# Patient Record
Sex: Female | Born: 1945
Health system: Southern US, Community
[De-identification: ages and names within clinical notes are randomized; demographics above are authoritative.]

## PROBLEM LIST (undated history)

## (undated) DIAGNOSIS — M5126 Other intervertebral disc displacement, lumbar region: Secondary | ICD-10-CM

## (undated) DIAGNOSIS — I10 Essential (primary) hypertension: Secondary | ICD-10-CM

## (undated) DIAGNOSIS — G459 Transient cerebral ischemic attack, unspecified: Secondary | ICD-10-CM

## (undated) DIAGNOSIS — K449 Diaphragmatic hernia without obstruction or gangrene: Secondary | ICD-10-CM

---

## 2010-11-13 ENCOUNTER — Emergency Department (HOSPITAL_BASED_OUTPATIENT_CLINIC_OR_DEPARTMENT_OTHER)
Admission: EM | Admit: 2010-11-13 | Discharge: 2010-11-13 | Payer: Self-pay | Source: Home / Self Care | Admitting: Emergency Medicine

## 2011-11-16 ENCOUNTER — Encounter: Payer: Self-pay | Admitting: Emergency Medicine

## 2011-11-16 ENCOUNTER — Emergency Department (HOSPITAL_BASED_OUTPATIENT_CLINIC_OR_DEPARTMENT_OTHER)
Admission: EM | Admit: 2011-11-16 | Discharge: 2011-11-16 | Disposition: A | Discharge status: Home / Self Care | Payer: No Typology Code available for payment source | Attending: Emergency Medicine | Admitting: Emergency Medicine

## 2011-11-16 ENCOUNTER — Emergency Department (INDEPENDENT_AMBULATORY_CARE_PROVIDER_SITE_OTHER): Payer: No Typology Code available for payment source

## 2011-11-16 DIAGNOSIS — I10 Essential (primary) hypertension: Secondary | ICD-10-CM | POA: Insufficient documentation

## 2011-11-16 DIAGNOSIS — J40 Bronchitis, not specified as acute or chronic: Secondary | ICD-10-CM | POA: Insufficient documentation

## 2011-11-16 DIAGNOSIS — R05 Cough: Secondary | ICD-10-CM

## 2011-11-16 DIAGNOSIS — Z8679 Personal history of other diseases of the circulatory system: Secondary | ICD-10-CM | POA: Insufficient documentation

## 2011-11-16 DIAGNOSIS — E119 Type 2 diabetes mellitus without complications: Secondary | ICD-10-CM | POA: Insufficient documentation

## 2011-11-16 DIAGNOSIS — H9209 Otalgia, unspecified ear: Secondary | ICD-10-CM | POA: Insufficient documentation

## 2011-11-16 DIAGNOSIS — R509 Fever, unspecified: Secondary | ICD-10-CM

## 2011-11-16 DIAGNOSIS — R059 Cough, unspecified: Secondary | ICD-10-CM

## 2011-11-16 HISTORY — DX: Essential (primary) hypertension: I10

## 2011-11-16 HISTORY — DX: Other intervertebral disc displacement, lumbar region: M51.26

## 2011-11-16 HISTORY — DX: Diaphragmatic hernia without obstruction or gangrene: K44.9

## 2011-11-16 HISTORY — DX: Transient cerebral ischemic attack, unspecified: G45.9

## 2011-11-16 LAB — URINALYSIS, ROUTINE W REFLEX MICROSCOPIC
Glucose, UA: NEGATIVE mg/dL
Ketones, ur: NEGATIVE mg/dL
Nitrite: NEGATIVE
Specific Gravity, Urine: 1.024 (ref 1.005–1.030)
Urobilinogen, UA: 0.2 mg/dL (ref 0.0–1.0)

## 2011-11-16 MED ORDER — CYCLOBENZAPRINE HCL 10 MG PO TABS: 5.0000 mg | ORAL_TABLET | Freq: Once | ORAL | Status: DC

## 2011-11-16 MED ORDER — ADENOSINE 6 MG/2ML IV SOLN
INTRAVENOUS | Status: AC
  Filled 2011-11-16: qty 6

## 2011-11-16 MED ORDER — AZITHROMYCIN 250 MG PO TABS: 250.0000 mg | ORAL_TABLET | Freq: Every day | ORAL | Status: AC

## 2011-11-16 NOTE — ED Notes (Signed)
Pt c/o sore throat, ear pan bilaterally since last night

## 2011-11-16 NOTE — ED Provider Notes (Signed)
History     CSN: [id number]  Arrival date & time 11/16/11  4098   First MD Initiated Contact with Patient 11/16/11 1920      Chief Complaint  Patient presents with  . Sore Throat  . Otalgia    (Consider location/radiation/quality/duration/timing/severity/associated sxs/prior treatment) HPI Comments: Pt states that she has a cough and she feels like her throat is closing:pt is also having bilateral ear pain  Patient is a 65 y.o. female presenting with pharyngitis. The history is provided by the patient. No language interpreter was used.  Sore Throat This is a new problem. The current episode started yesterday. The problem occurs constantly. The problem has been unchanged. Associated symptoms include a sore throat. Pertinent negatives include no fever or weakness. The symptoms are aggravated by swallowing.    Past Medical History  Diagnosis Date  . Hypertension   . Diabetes mellitus   . Hiatal hernia   . Lumbar disc herniation   . TIA (transient ischemic attack)     History reviewed. No pertinent past surgical history.  No family history on file.  History  Substance Use Topics  . Smoking status: Never Smoker   . Smokeless tobacco: Not on file  . Alcohol Use: No    OB History    Grav Para Term Preterm Abortions TAB SAB Ect Mult Living                  Review of Systems  Constitutional: Negative for fever.  HENT: Positive for sore throat.   Neurological: Negative for weakness.  All other systems reviewed and are negative.    Allergies  Amoxicillin  Home Medications   Current Outpatient Rx  Name Route Sig Dispense Refill  . LOSARTAN POTASSIUM 100 MG PO TABS Oral Take 100 mg by mouth daily.      Marland Kitchen METFORMIN HCL 500 MG PO TABS Oral Take 500 mg by mouth 3 (three) times daily.      Marland Kitchen PANTOPRAZOLE SODIUM 40 MG PO TBEC Oral Take 40 mg by mouth daily as needed. For acid reflux     . AZITHROMYCIN 250 MG PO TABS Oral Take 1 tablet (250 mg total) by mouth daily.  Take first 2 tablets together, then 1 every day until finished. 6 tablet 0    BP 165/56  Pulse 100  Temp(Src) 99.1 F (37.3 C) (Oral)  Resp 18  SpO2 98%  Physical Exam  Nursing note and vitals reviewed. Constitutional: She is oriented to person, place, and time. She appears well-developed and well-nourished.  HENT:  Head: Normocephalic and atraumatic.  Right Ear: External ear normal.  Left Ear: External ear normal.  Mouth/Throat: Posterior oropharyngeal erythema present.  Neck: Normal range of motion. Neck supple.  Cardiovascular: Normal rate and regular rhythm.   Pulmonary/Chest: Effort normal and breath sounds normal.  Abdominal: Soft. Bowel sounds are normal.  Musculoskeletal: Normal range of motion.  Neurological: She is alert and oriented to person, place, and time.  Skin: Skin is warm and dry.    ED Course  Procedures (including critical care time)   Labs Reviewed  RAPID STREP SCREEN  URINALYSIS, ROUTINE W REFLEX MICROSCOPIC   Dg Chest 2 View  11/16/2011  *RADIOLOGY REPORT*  Clinical Data: Cough, fever  CHEST - 2 VIEW  Comparison: 11/13/2010  Findings: Cardiomediastinal silhouette is stable.  No acute infiltrate or pleural effusion.  No pulmonary edema.  Central mild increase bronchial markings without focal consolidation.  IMPRESSION: No acute infiltrate or pulmonary edema.  Mild bronchitic changes are suspected.  Original Report Authenticated By: Natasha Mead, M.D.     1. Bronchitis       MDM  Pt in no acute distress:will treat         Teressa Lower, NP 11/16/11 2054

## 2011-11-18 NOTE — ED Provider Notes (Signed)
History/physical exam/procedure(s) were performed by non-physician practitioner and as supervising physician I was immediately available for consultation/collaboration. I have reviewed all notes and am in agreement with care and plan.   Hilario Quarry, MD 11/18/11 [phone]

## 2019-03-12 ENCOUNTER — Other Ambulatory Visit: Payer: Self-pay

## 2019-03-12 ENCOUNTER — Encounter (HOSPITAL_BASED_OUTPATIENT_CLINIC_OR_DEPARTMENT_OTHER): Payer: Self-pay | Admitting: Emergency Medicine

## 2019-03-12 ENCOUNTER — Emergency Department (HOSPITAL_BASED_OUTPATIENT_CLINIC_OR_DEPARTMENT_OTHER): Payer: No Typology Code available for payment source

## 2019-03-12 ENCOUNTER — Emergency Department (HOSPITAL_BASED_OUTPATIENT_CLINIC_OR_DEPARTMENT_OTHER)
Admission: EM | Admit: 2019-03-12 | Discharge: 2019-03-12 | Disposition: A | Discharge status: Home / Self Care | Payer: No Typology Code available for payment source | Attending: Emergency Medicine | Admitting: Emergency Medicine

## 2019-03-12 DIAGNOSIS — E119 Type 2 diabetes mellitus without complications: Secondary | ICD-10-CM | POA: Diagnosis not present

## 2019-03-12 DIAGNOSIS — I10 Essential (primary) hypertension: Secondary | ICD-10-CM | POA: Diagnosis not present

## 2019-03-12 DIAGNOSIS — M5412 Radiculopathy, cervical region: Secondary | ICD-10-CM | POA: Diagnosis not present

## 2019-03-12 DIAGNOSIS — Z7984 Long term (current) use of oral hypoglycemic drugs: Secondary | ICD-10-CM | POA: Insufficient documentation

## 2019-03-12 DIAGNOSIS — M549 Dorsalgia, unspecified: Secondary | ICD-10-CM | POA: Diagnosis present

## 2019-03-12 DIAGNOSIS — K029 Dental caries, unspecified: Secondary | ICD-10-CM | POA: Insufficient documentation

## 2019-03-12 DIAGNOSIS — Z79899 Other long term (current) drug therapy: Secondary | ICD-10-CM | POA: Insufficient documentation

## 2019-03-12 LAB — URINALYSIS, ROUTINE W REFLEX MICROSCOPIC
Bilirubin Urine: NEGATIVE
Glucose, UA: NEGATIVE mg/dL
Hgb urine dipstick: NEGATIVE
Ketones, ur: NEGATIVE mg/dL
Leukocytes,Ua: NEGATIVE
Nitrite: NEGATIVE
Protein, ur: NEGATIVE mg/dL
Specific Gravity, Urine: 1.02 (ref 1.005–1.030)
pH: 6 (ref 5.0–8.0)

## 2019-03-12 MED ORDER — CLINDAMYCIN HCL 300 MG PO CAPS: 300.0000 mg | ORAL_CAPSULE | Freq: Four times a day (QID) | ORAL | 0 refills | Status: AC

## 2019-03-12 MED ORDER — CLINDAMYCIN HCL 150 MG PO CAPS
300.0000 mg | ORAL_CAPSULE | Freq: Once | ORAL | Status: AC
  Administered 2019-03-12: 300 mg via ORAL
  Filled 2019-03-12: qty 2

## 2019-03-12 MED ORDER — DIAZEPAM 5 MG PO TABS: 5.0000 mg | ORAL_TABLET | Freq: Two times a day (BID) | ORAL | 0 refills | Status: AC

## 2019-03-12 MED ORDER — HYDROCODONE-ACETAMINOPHEN 5-325 MG PO TABS: 1.0000 | ORAL_TABLET | ORAL | 0 refills | Status: AC | PRN

## 2019-03-12 MED ORDER — KETOROLAC TROMETHAMINE 30 MG/ML IJ SOLN
30.0000 mg | Freq: Once | INTRAMUSCULAR | Status: AC
Start: 2019-03-12 — End: 2019-03-12
  Administered 2019-03-12: 13:00:00 30 mg via INTRAMUSCULAR
  Filled 2019-03-12: qty 1

## 2019-03-12 MED FILL — CLINDAMYCIN HCL 300 MG CAP: 300 | 7 days supply | Qty: 28 | Fill #0

## 2019-03-12 MED FILL — diazePAM 5 MG TABS: 5 | 5 days supply | Qty: 10 | Fill #0

## 2019-03-12 MED FILL — HYDROCODON-APAP 5-325: 5-325 | 10 days supply | Qty: 10 | Fill #0

## 2019-03-12 NOTE — ED Provider Notes (Addendum)
[facility] EMERGENCY DEPARTMENT Provider Note   CSN: [id number] Arrival date & time: 03/12/19  1135    History   Chief Complaint Chief Complaint  Patient presents with   Back Pain    HPI Evelyn Turner is a 73 y.o. female.     Pt presents to the ED today with left chest wall pain.  She said it only hurts when she moves.  She said it's been going on for 3 months.  She also has some numbness to her left 4th and 5th fingers.  She did an e-visit yesterday with her doctor and was told to take tylenol and ibuprofen.  She denies cough or fever.  No rash.  No trauma.  She has no known exposure to Covid-19.     Past Medical History:  Diagnosis Date   Diabetes mellitus    Hiatal hernia    Hypertension    Lumbar disc herniation    TIA (transient ischemic attack)     There are no active problems to display for this patient.   History reviewed. No pertinent surgical history.   OB History   No obstetric history on file.      Home Medications    Prior to Admission medications   Medication Sig Start Date End Date Taking? Authorizing Provider  acetaminophen (TYLENOL) 500 MG tablet Take by mouth. 03/22/17  Yes [provider]  furosemide (LASIX) 20 MG tablet Take by mouth. 08/29/18 08/29/19 Yes [provider]  glipiZIDE (GLUCOTROL) 5 MG tablet Take by mouth. 01/21/18 04/23/19 Yes [provider]  latanoprost (XALATAN) 0.005 % ophthalmic solution Apply to eye. 01/14/19 01/14/20 Yes [provider]  losartan (COZAAR) 25 MG tablet Take by mouth. 07/22/18  Yes [provider]  triamcinolone acetonide (TRIESENCE) 40 MG/ML SUSP 1 mL by Injection route as needed 04/29/18  Yes [provider]  albuterol (VENTOLIN HFA) 108 (90 Base) MCG/ACT inhaler INHALE 2 PUFFS PO Q 4 H PRF ASTHMA 03/06/19   [provider]  atorvastatin (LIPITOR) 40 MG tablet TK 1 T PO QD 02/19/19   [provider]  clindamycin (CLEOCIN) 300  MG capsule Take 1 capsule (300 mg total) by mouth every 6 (six) hours. 03/12/19   Jacalyn LefevreHaviland, Cianni Manny, MD  diazepam (VALIUM) 5 MG tablet Take 1 tablet (5 mg total) by mouth 2 (two) times daily. 03/12/19   Jacalyn LefevreHaviland, Laytoya Ion, MD  HYDROcodone-acetaminophen (NORCO/VICODIN) 5-325 MG tablet Take 1 tablet by mouth every 4 (four) hours as needed. 03/12/19   Jacalyn LefevreHaviland, Chardai Gangemi, MD  metFORMIN (GLUCOPHAGE) 500 MG tablet Take 500 mg by mouth 3 (three) times daily.      [provider]  pantoprazole (PROTONIX) 40 MG tablet Take 40 mg by mouth daily as needed. For acid reflux     [provider]    Family History No family history on file.  Social History Social History   Tobacco Use   Smoking status: Never Smoker   Smokeless tobacco: Never Used  Substance Use Topics   Alcohol use: No   Drug use: No     Allergies   Amoxicillin   Review of Systems Review of Systems  Musculoskeletal: Positive for back pain.  Neurological: Positive for numbness.  All other systems reviewed and are negative.    Physical Exam Updated Vital Signs BP (!) 152/62 (BP Location: Right Arm)    Pulse 88    Temp 98.2 F (36.8 C) (Oral)    Resp 14    Ht 5'  6" (1.676 m)    Wt 102.1 kg    SpO2 100%    BMI 36.32 kg/m   Physical Exam Vitals signs and nursing note reviewed.  Constitutional:      Appearance: Normal appearance.  HENT:     Head: Normocephalic and atraumatic.     Comments: Dental caries    Right Ear: External ear normal.     Left Ear: External ear normal.     Nose: Nose normal.     Mouth/Throat:     Mouth: Mucous membranes are moist.  Eyes:     Extraocular Movements: Extraocular movements intact.     Conjunctiva/sclera: Conjunctivae normal.     Pupils: Pupils are equal, round, and reactive to light.  Neck:     Musculoskeletal: Normal range of motion and neck supple.  Cardiovascular:     Rate and Rhythm: Normal rate and regular rhythm.     Pulses: Normal pulses.     Heart sounds:  Normal heart sounds.  Pulmonary:     Effort: Pulmonary effort is normal.     Breath sounds: Normal breath sounds.  Abdominal:     General: Abdomen is flat. Bowel sounds are normal.     Palpations: Abdomen is soft.  Musculoskeletal: Normal range of motion.  Skin:    General: Skin is warm.     Capillary Refill: Capillary refill takes less than 2 seconds.  Neurological:     General: No focal deficit present.     Mental Status: She is alert and oriented to person, place, and time.  Psychiatric:        Mood and Affect: Mood normal.        Behavior: Behavior normal.      ED Treatments / Results  Labs (all labs ordered are listed, but only abnormal results are displayed) Labs Reviewed  URINALYSIS, ROUTINE W REFLEX MICROSCOPIC    EKG None  Radiology Dg Ribs Unilateral W/chest Left  Result Date: 03/12/2019 CLINICAL DATA:  Left lower posterolateral rib pain radiating to the left scapula for the past 3 months. No injury. EXAM: LEFT RIBS AND CHEST - 3+ VIEW COMPARISON:  Chest x-ray dated November 16, 2011. FINDINGS: The heart size and mediastinal contours are within normal limits. Normal pulmonary vascularity. Mildly coarsened interstitial markings are similar to prior study, albeit slightly more conspicuous. No focal consolidation, pleural effusion, or pneumothorax. No acute osseous abnormality. No rib fracture or focal lesion. IMPRESSION: Negative. Electronically Signed   By: Obie Dredge M.D.   On: 03/12/2019 12:56   Dg Cervical Spine Complete  Result Date: 03/12/2019 CLINICAL DATA:  Left-sided neck pain for the past 3 months. No injury. EXAM: CERVICAL SPINE - COMPLETE 4+ VIEW COMPARISON:  None. FINDINGS: The lateral view is diagnostic to the T1 level. There is no acute fracture or subluxation. Vertebral body heights are preserved. Straightening of the normal cervical lordosis with trace anterolisthesis at C3-C4. Interveterbral disc spaces are maintained. Mild anterior endplate  spurring at C5-C6 and C6-C7. Moderate facet arthropathy at C7-T1. No neuroforaminal stenosis. Normal prevertebral soft tissues. IMPRESSION: 1. Mild for age lower cervical spondylosis. No acute osseous abnormality. Electronically Signed   By: Obie Dredge M.D.   On: 03/12/2019 12:58    Procedures Procedures (including critical care time)  Medications Ordered in ED Medications  ketorolac (TORADOL) 30 MG/ML injection 30 mg (30 mg Intramuscular Given 03/12/19 1242)  clindamycin (CLEOCIN) capsule 300 mg (300 mg Oral Given 03/12/19 1241)     Initial Impression /  Assessment and Plan / ED Course  I have reviewed the triage vital signs and the nursing notes.  Pertinent labs & imaging results that were available during my care of the patient were reviewed by me and considered in my medical decision making (see chart for details).       She likely has cervical radiculopathy with her numbness to her fingers.  She is a diabetic, so I am not going to give her steroids.  She will be d/c home with valium/lortab.  Cleocin for dental caries.  She is given exercises.  She is told to f/u with her pcp when she gets home.  Return if worse. Final Clinical Impressions(s) / ED Diagnoses   Final diagnoses:  Cervical radiculopathy  Dental caries    ED Discharge Orders         Ordered    diazepam (VALIUM) 5 MG tablet  2 times daily     03/12/19 1309    HYDROcodone-acetaminophen (NORCO/VICODIN) 5-325 MG tablet  Every 4 hours PRN     03/12/19 1309    clindamycin (CLEOCIN) 300 MG capsule  Every 6 hours     03/12/19 1342           Jacalyn Lefevre, MD 03/12/19 1314    Jacalyn Lefevre, MD 03/12/19 1344

## 2019-03-12 NOTE — ED Notes (Signed)
Patient transported to X-ray 

## 2019-03-12 NOTE — ED Triage Notes (Signed)
Pt c/o LT rib/back pain that radiates to LT shoulder x 3 months

## 2020-04-05 ENCOUNTER — Encounter (HOSPITAL_BASED_OUTPATIENT_CLINIC_OR_DEPARTMENT_OTHER): Payer: Self-pay | Admitting: *Deleted

## 2020-04-05 ENCOUNTER — Other Ambulatory Visit: Payer: Self-pay

## 2020-04-05 ENCOUNTER — Emergency Department (HOSPITAL_BASED_OUTPATIENT_CLINIC_OR_DEPARTMENT_OTHER)
Admission: EM | Admit: 2020-04-05 | Discharge: 2020-04-05 | Disposition: A | Discharge status: Home / Self Care | Payer: PRIVATE HEALTH INSURANCE | Attending: Emergency Medicine | Admitting: Emergency Medicine

## 2020-04-05 ENCOUNTER — Emergency Department (HOSPITAL_BASED_OUTPATIENT_CLINIC_OR_DEPARTMENT_OTHER): Payer: PRIVATE HEALTH INSURANCE

## 2020-04-05 DIAGNOSIS — Z7984 Long term (current) use of oral hypoglycemic drugs: Secondary | ICD-10-CM | POA: Insufficient documentation

## 2020-04-05 DIAGNOSIS — S0033XA Contusion of nose, initial encounter: Secondary | ICD-10-CM | POA: Insufficient documentation

## 2020-04-05 DIAGNOSIS — Z79899 Other long term (current) drug therapy: Secondary | ICD-10-CM | POA: Diagnosis not present

## 2020-04-05 DIAGNOSIS — I10 Essential (primary) hypertension: Secondary | ICD-10-CM | POA: Diagnosis not present

## 2020-04-05 DIAGNOSIS — E119 Type 2 diabetes mellitus without complications: Secondary | ICD-10-CM | POA: Diagnosis not present

## 2020-04-05 DIAGNOSIS — W208XXA Other cause of strike by thrown, projected or falling object, initial encounter: Secondary | ICD-10-CM | POA: Diagnosis not present

## 2020-04-05 DIAGNOSIS — Y92513 Shop (commercial) as the place of occurrence of the external cause: Secondary | ICD-10-CM | POA: Insufficient documentation

## 2020-04-05 DIAGNOSIS — Y9389 Activity, other specified: Secondary | ICD-10-CM | POA: Diagnosis not present

## 2020-04-05 DIAGNOSIS — Y999 Unspecified external cause status: Secondary | ICD-10-CM | POA: Insufficient documentation

## 2020-04-05 DIAGNOSIS — S0083XA Contusion of other part of head, initial encounter: Secondary | ICD-10-CM

## 2020-04-05 DIAGNOSIS — S0993XA Unspecified injury of face, initial encounter: Secondary | ICD-10-CM | POA: Diagnosis present

## 2020-04-05 NOTE — ED Triage Notes (Signed)
She was at [facility] last night and when she reached for a water hose sprinkler it fell her in the nose.

## 2020-04-05 NOTE — Discharge Instructions (Addendum)
Take Tylenol and continue ice on the area as needed to help with pain. Follow-up with your primary care provider or the 1 listed below. Return to the ER if you start to have worsening pain, swelling, headache, blurry vision or injuries.

## 2020-04-05 NOTE — ED Provider Notes (Signed)
[facility] EMERGENCY DEPARTMENT Provider Note   CSN: [id number] Arrival date & time: 04/05/20  2052     History Chief Complaint  Patient presents with  . Facial Injury    Evelyn Turner is a 74 y.o. female with a past medical history of hypertension presenting to the ED with a chief complaint of facial pain.  Yesterday was at [facility] trying to pick out an oscillating sprinkler for her yard.  She reached up to try to grab 1 when a box fell and hit her on the bridge of her nose.  She denies any loss of consciousness.  She woke up today with worsening pain and was encouraged to come to the ER by family member.  She has not been taking medications to help with her pain.  She denies any headache, blurry vision, changes to gait, numbness in arms or legs, nosebleeds, anticoagulant use.  HPI     Past Medical History:  Diagnosis Date  . Diabetes mellitus   . Hiatal hernia   . Hypertension   . Lumbar disc herniation   . TIA (transient ischemic attack)     There are no problems to display for this patient.   History reviewed. No pertinent surgical history.   OB History   No obstetric history on file.     No family history on file.  Social History   Tobacco Use  . Smoking status: Never Smoker  . Smokeless tobacco: Never Used  Substance Use Topics  . Alcohol use: No  . Drug use: No    Home Medications Prior to Admission medications   Medication Sig Start Date End Date Taking? Authorizing Provider  acetaminophen (TYLENOL) 500 MG tablet Take by mouth. 03/22/17   [provider]  albuterol (VENTOLIN HFA) 108 (90 Base) MCG/ACT inhaler INHALE 2 PUFFS PO Q 4 H PRF ASTHMA 03/06/19   [provider]  atorvastatin (LIPITOR) 40 MG tablet TK 1 T PO QD 02/19/19   [provider]  clindamycin (CLEOCIN) 300 MG capsule Take 1 capsule (300 mg total) by mouth every 6 (six) hours. 03/12/19   Isla Pence, MD  diazepam (VALIUM) 5 MG tablet Take 1 tablet (5  mg total) by mouth 2 (two) times daily. 03/12/19   Isla Pence, MD  furosemide (LASIX) 20 MG tablet Take by mouth. 08/29/18 08/29/19  [provider]  glipiZIDE (GLUCOTROL) 5 MG tablet Take by mouth. 01/21/18 04/23/19  [provider]  HYDROcodone-acetaminophen (NORCO/VICODIN) 5-325 MG tablet Take 1 tablet by mouth every 4 (four) hours as needed. 03/12/19   Isla Pence, MD  losartan (COZAAR) 25 MG tablet Take by mouth. 07/22/18   [provider]  metFORMIN (GLUCOPHAGE) 500 MG tablet Take 500 mg by mouth 3 (three) times daily.      [provider]  pantoprazole (PROTONIX) 40 MG tablet Take 40 mg by mouth daily as needed. For acid reflux     [provider]  triamcinolone acetonide (TRIESENCE) 40 MG/ML SUSP 1 mL by Injection route as needed 04/29/18   [provider]    Allergies    Amoxicillin  Review of Systems   Review of Systems  Constitutional: Negative for chills and fever.  HENT:       +facial pain  Eyes: Negative for visual disturbance.  Neurological: Negative for syncope, light-headedness and headaches.    Physical Exam Updated Vital Signs BP (!) 168/76   Pulse 79   Temp 99.1 F (37.3 C) (Oral)   Resp  16   Ht 5\' 6"  (1.676 m)   Wt 115.8 kg   SpO2 98%   BMI 41.19 kg/m   Physical Exam Vitals and nursing note reviewed.  Constitutional:      General: She is not in acute distress.    Appearance: She is well-developed. She is not diaphoretic.  HENT:     Head: Normocephalic and atraumatic.     Nose: Nasal tenderness present.     Right Nostril: No septal hematoma.     Left Nostril: No septal hematoma.      Comments: Tenderness to the bridge of the nose without wounds, erythema or swelling noted. Eyes:     General: No scleral icterus.    Conjunctiva/sclera: Conjunctivae normal.  Pulmonary:     Effort: Pulmonary effort is normal. No respiratory distress.  Musculoskeletal:     Cervical back: Normal range of motion.    Skin:    Findings: No rash.  Neurological:     General: No focal deficit present.     Mental Status: She is alert and oriented to person, place, and time.     Cranial Nerves: No cranial nerve deficit.     Motor: No weakness.     ED Results / Procedures / Treatments   Labs (all labs ordered are listed, but only abnormal results are displayed) Labs Reviewed - No data to display  EKG None  Radiology CT Maxillofacial Wo Contrast  Result Date: 04/05/2020 CLINICAL DATA:  Status post trauma. EXAM: CT MAXILLOFACIAL WITHOUT CONTRAST TECHNIQUE: Multidetector CT imaging of the maxillofacial structures was performed. Multiplanar CT image reconstructions were also generated. COMPARISON:  None. FINDINGS: Osseous: No fracture or mandibular dislocation. No destructive process. Orbits: Negative. No traumatic or inflammatory finding. Sinuses: Clear. Soft tissues: Negative. Limited intracranial: No significant or unexpected finding. IMPRESSION: Negative for acute fracture or mandibular dislocation. Electronically Signed   By: 04/07/2020 M.D.   On: 04/05/2020 22:01    Procedures Procedures (including critical care time)  Medications Ordered in ED Medications - No data to display  ED Course  I have reviewed the triage vital signs and the nursing notes.  Pertinent labs & imaging results that were available during my care of the patient were reviewed by me and considered in my medical decision making (see chart for details).    MDM Rules/Calculators/A&P                      74 year old female presenting to the ED with a chief complaint of nose bridge pain after a box falling on her face yesterday.  Denies any loss of consciousness, headache, blurry vision.  There is times palpation of the bridge of the nose without significant edema no open wounds or lacerations noted.  No neuro deficits noted.  CT maxillofacial without any acute fractures or other abnormalities.  Suspect that symptoms are  due to a contusion.  We will have her continue over-the-counter pain medication, apply ice to the area and have her follow-up with her PCP. Patient discussed with and seen by the attending, Dr. [id number].   All imaging, if done today, including plain films, CT scans, and ultrasounds, independently reviewed by me, and interpretations confirmed via formal radiology reads.  Patient is hemodynamically stable, in NAD, and able to ambulate in the ED. Evaluation does not show pathology that would require ongoing emergent intervention or inpatient treatment. I explained the diagnosis to the patient. Pain has been managed and has no complaints prior to  discharge. Patient is comfortable with above plan and is stable for discharge at this time. All questions were answered prior to disposition. Strict return precautions for returning to the ED were discussed. Encouraged follow up with PCP.   An After Visit Summary was printed and given to the patient.   Portions of this note were generated with Scientist, clinical (histocompatibility and immunogenetics). Dictation errors may occur despite best attempts at proofreading.  Final Clinical Impression(s) / ED Diagnoses Final diagnoses:  Contusion of face, initial encounter    Rx / DC Orders ED Discharge Orders    None       Dietrich Pates, PA-C 04/05/20 2248    Milagros Loll, MD 04/06/20 1308

## 2020-04-05 NOTE — ED Notes (Addendum)
Charted on wrong patient

## 2020-04-17 ENCOUNTER — Emergency Department (HOSPITAL_BASED_OUTPATIENT_CLINIC_OR_DEPARTMENT_OTHER): Payer: PRIVATE HEALTH INSURANCE

## 2020-04-17 ENCOUNTER — Other Ambulatory Visit: Payer: Self-pay

## 2020-04-17 ENCOUNTER — Encounter (HOSPITAL_BASED_OUTPATIENT_CLINIC_OR_DEPARTMENT_OTHER): Payer: Self-pay | Admitting: Emergency Medicine

## 2020-04-17 ENCOUNTER — Emergency Department (HOSPITAL_BASED_OUTPATIENT_CLINIC_OR_DEPARTMENT_OTHER)
Admission: EM | Admit: 2020-04-17 | Discharge: 2020-04-17 | Disposition: A | Discharge status: Home / Self Care | Payer: PRIVATE HEALTH INSURANCE | Attending: Emergency Medicine | Admitting: Emergency Medicine

## 2020-04-17 DIAGNOSIS — M79601 Pain in right arm: Secondary | ICD-10-CM | POA: Diagnosis not present

## 2020-04-17 DIAGNOSIS — E119 Type 2 diabetes mellitus without complications: Secondary | ICD-10-CM | POA: Diagnosis not present

## 2020-04-17 DIAGNOSIS — Z880 Allergy status to penicillin: Secondary | ICD-10-CM | POA: Insufficient documentation

## 2020-04-17 DIAGNOSIS — Z79899 Other long term (current) drug therapy: Secondary | ICD-10-CM | POA: Insufficient documentation

## 2020-04-17 DIAGNOSIS — Z7984 Long term (current) use of oral hypoglycemic drugs: Secondary | ICD-10-CM | POA: Insufficient documentation

## 2020-04-17 DIAGNOSIS — R0789 Other chest pain: Secondary | ICD-10-CM | POA: Diagnosis not present

## 2020-04-17 DIAGNOSIS — I10 Essential (primary) hypertension: Secondary | ICD-10-CM | POA: Diagnosis not present

## 2020-04-17 DIAGNOSIS — Z8673 Personal history of transient ischemic attack (TIA), and cerebral infarction without residual deficits: Secondary | ICD-10-CM | POA: Insufficient documentation

## 2020-04-17 MED ORDER — LIDOCAINE 5 % EX PTCH: 1.0000 | MEDICATED_PATCH | CUTANEOUS | 0 refills | Status: AC

## 2020-04-17 NOTE — Discharge Instructions (Signed)
Please read and follow all provided instructions.  Your diagnoses today include:  1. Right arm pain     Tests performed today include:  An x-ray of the affected areas - do NOT show any broken bones  Vital signs. See below for your results today.   Medications prescribed:   Lidoderm patches - topical medication for pain  Take any prescribed medications only as directed.  Home care instructions:   Follow any educational materials contained in this packet  Follow R.I.C.E. Protocol:  R - rest your injury   I  - use ice on injury without applying directly to skin  C - compress injury with bandage or splint  E - elevate the injury as much as possible  Follow-up instructions: Please follow-up with your primary care provider if you continue to have significant pain in 1 week. In this case you may have a more severe injury that requires further care.   Return instructions:   Please return if your fingers are numb or tingling, appear gray or blue, or you have severe pain (also elevate the arm and loosen splint or wrap if you were given one)  Please return to the Emergency Department if you experience worsening symptoms.   Please return if you have any other emergent concerns.  Additional Information:  Your vital signs today were: BP (!) 159/57 (BP Location: Left Arm)   Pulse 74   Temp 98 F (36.7 C) (Oral)   Resp 18   Ht 5' 5.5" (1.664 m)   Wt 115.7 kg   SpO2 100%   BMI 41.79 kg/m  If your blood pressure (BP) was elevated above 135/85 this visit, please have this repeated by your doctor within one month. --------------

## 2020-04-17 NOTE — ED Triage Notes (Signed)
She ran into a door yesterday, c/o R arm pain

## 2020-04-17 NOTE — ED Provider Notes (Signed)
[facility] EMERGENCY DEPARTMENT Provider Note   CSN: [id number] Arrival date & time: 04/17/20  1015     History Chief Complaint  Patient presents with  . Arm Injury    Evelyn Turner is a 74 y.o. female.  Patient presents to the emergency department with her daughter today after an injury occurring late last night.  Patient states that she was running from a dog and ran into the door jam with the right side of her body injuring her right arm and ribs.  She has pain in the entire right arm and right lateral ribs.  The pain seems to be worse in the right hand at the base of the index and middle fingers, just proximal to the right elbow, and right shoulder.  She has been applying ice.  She took Tylenol at home.  None of this is helped very much.  Pain is worse with movement and palpation.  No numbness or tingling in the hand.  No neck pain.  She did not hit her head.        Past Medical History:  Diagnosis Date  . Diabetes mellitus   . Hiatal hernia   . Hypertension   . Lumbar disc herniation   . TIA (transient ischemic attack)     There are no problems to display for this patient.   History reviewed. No pertinent surgical history.   OB History   No obstetric history on file.     No family history on file.  Social History   Tobacco Use  . Smoking status: Never Smoker  . Smokeless tobacco: Never Used  Substance Use Topics  . Alcohol use: No  . Drug use: No    Home Medications Prior to Admission medications   Medication Sig Start Date End Date Taking? Authorizing Provider  acetaminophen (TYLENOL) 500 MG tablet Take by mouth. 03/22/17   [provider]  albuterol (VENTOLIN HFA) 108 (90 Base) MCG/ACT inhaler INHALE 2 PUFFS PO Q 4 H PRF ASTHMA 03/06/19   [provider]  atorvastatin (LIPITOR) 40 MG tablet TK 1 T PO QD 02/19/19   [provider]  clindamycin (CLEOCIN) 300 MG capsule Take 1 capsule (300 mg total) by mouth every 6  (six) hours. 03/12/19   Jacalyn Lefevre, MD  diazepam (VALIUM) 5 MG tablet Take 1 tablet (5 mg total) by mouth 2 (two) times daily. 03/12/19   Jacalyn Lefevre, MD  furosemide (LASIX) 20 MG tablet Take by mouth. 08/29/18 08/29/19  [provider]  glipiZIDE (GLUCOTROL) 5 MG tablet Take by mouth. 01/21/18 04/23/19  [provider]  HYDROcodone-acetaminophen (NORCO/VICODIN) 5-325 MG tablet Take 1 tablet by mouth every 4 (four) hours as needed. 03/12/19   Jacalyn Lefevre, MD  losartan (COZAAR) 25 MG tablet Take by mouth. 07/22/18   [provider]  metFORMIN (GLUCOPHAGE) 500 MG tablet Take 500 mg by mouth 3 (three) times daily.      [provider]  pantoprazole (PROTONIX) 40 MG tablet Take 40 mg by mouth daily as needed. For acid reflux     [provider]  triamcinolone acetonide (TRIESENCE) 40 MG/ML SUSP 1 mL by Injection route as needed 04/29/18   [provider]    Allergies    Amoxicillin  Review of Systems   Review of Systems  Constitutional: Negative for activity change.  Cardiovascular: Positive for chest pain.  Musculoskeletal: Positive for arthralgias and myalgias. Negative for back pain, joint swelling and neck pain.  Skin: Negative  for color change and wound.  Neurological: Negative for weakness and numbness.    Physical Exam Updated Vital Signs BP (!) 159/57 (BP Location: Left Arm)   Pulse 74   Temp 98 F (36.7 C) (Oral)   Resp 18   Ht 5' 5.5" (1.664 m)   Wt 115.7 kg   SpO2 100%   BMI 41.79 kg/m   Physical Exam Vitals and nursing note reviewed.  Constitutional:      Appearance: She is well-developed.  HENT:     Head: Normocephalic and atraumatic.  Eyes:     Pupils: Pupils are equal, round, and reactive to light.  Cardiovascular:     Rate and Rhythm: Normal rate.     Pulses: Normal pulses. No decreased pulses.     Comments: Chest wall pain over the lateral right upper ribs near the axilla. Pulmonary:     Effort:  Pulmonary effort is normal.     Breath sounds: Normal breath sounds.  Musculoskeletal:        General: Tenderness present.     Right shoulder: Tenderness and bony tenderness present. No swelling. Decreased range of motion. Normal strength.     Right upper arm: Tenderness present. No swelling, edema, deformity or bony tenderness.     Right elbow: No swelling or effusion. Normal range of motion. Tenderness present in olecranon process.     Right forearm: Tenderness present. No bony tenderness.     Right wrist: Tenderness present. No bony tenderness. Normal range of motion.     Right hand: Swelling, tenderness and bony tenderness present. No deformity. Decreased range of motion. Normal pulse.     Cervical back: Normal range of motion and neck supple. No tenderness. Normal range of motion.     Comments: Trace swelling in the MCP joint areas of the right index and middle fingers.  Distal circulation, motor, sensation intact.  Skin:    General: Skin is warm and dry.  Neurological:     Mental Status: She is alert.     Sensory: No sensory deficit.     Comments: Motor, sensation, and vascular distal to the injury is fully intact.      ED Results / Procedures / Treatments   Labs (all labs ordered are listed, but only abnormal results are displayed) Labs Reviewed - No data to display  EKG None  Radiology DG Chest 2 View  Result Date: 04/17/2020 CLINICAL DATA:  Pain after hitting heavy object EXAM: CHEST - 2 VIEW COMPARISON:  March 12, 2019 and November 16, 2011 FINDINGS: Lungs are clear. Heart size and pulmonary vascularity are normal. No adenopathy. No pneumothorax. No acute fracture is evident. There is anterior wedging of a lower thoracic vertebral body, stable. IMPRESSION: Lungs clear. Cardiac silhouette normal. No acute appearing fracture evident. Electronically Signed   By: Bretta Bang III M.D.   On: 04/17/2020 11:30   DG Forearm Right  Result Date: 04/17/2020 CLINICAL DATA:   Pain after hitting arm against heavy object EXAM: RIGHT FOREARM - 2 VIEW COMPARISON:  None. FINDINGS: Frontal and lateral views obtained. No acute fracture or dislocation. Calcification adjacent to the ulnar styloid may represent residua of old trauma in this area. Visualized joint spaces appear unremarkable. No elbow joint effusion. IMPRESSION: Question residua of old trauma in the region of the ulnar styloid. No acute fracture or dislocation. No appreciable arthropathy. Electronically Signed   By: Bretta Bang III M.D.   On: 04/17/2020 11:28   DG Humerus Right  Result  Date: 04/17/2020 CLINICAL DATA:  Pain after hitting arm against heavy object EXAM: RIGHT HUMERUS - 2+ VIEW COMPARISON:  None. FINDINGS: Frontal and lateral views were obtained. No fracture or dislocation. Joint spaces appear unremarkable. No abnormal periosteal reaction. IMPRESSION: No fracture or dislocation.  No appreciable arthropathic change. Electronically Signed   By: Lowella Grip III M.D.   On: 04/17/2020 11:28   DG Hand Complete Right  Result Date: 04/17/2020 CLINICAL DATA:  Pain after hitting upper extremity against solid object EXAM: RIGHT HAND - COMPLETE 3+ VIEW COMPARISON:  None. FINDINGS: Frontal, oblique, and lateral views were obtained. There is no acute fracture or dislocation. Calcification at the level of the ulnar styloid is well corticated and may represent residua of old trauma. There is spur formation in the first IP joint as well as in the second, third, and fifth DIP joints. Minimal joint space narrowing in these areas. There is mild joint space narrowing in the first and second MCP joints. Other joint spaces appear unremarkable. No erosive change. IMPRESSION: No acute fracture or dislocation. Question residua of old trauma at the ulnar styloid level. Relatively mild osteoarthritic change in multiple joints as noted. No erosive change. Electronically Signed   By: Lowella Grip III M.D.   On: 04/17/2020  11:27    Procedures Procedures (including critical care time)  Medications Ordered in ED Medications - No data to display  ED Course  I have reviewed the triage vital signs and the nursing notes.  Pertinent labs & imaging results that were available during my care of the patient were reviewed by me and considered in my medical decision making (see chart for details).  Patient seen and examined. Work-up initiated.  Patient declines pain medication.  Will give sling.  Vital signs reviewed and are as follows: BP (!) 159/57 (BP Location: Left Arm)   Pulse 74   Temp 98 F (36.7 C) (Oral)   Resp 18   Ht 5' 5.5" (1.664 m)   Wt 115.7 kg   SpO2 100%   BMI 41.79 kg/m   12:18 PM no acute injuries noted on x-ray imaging.  Patient updated.  She will be prescribed Lidoderm patch per her request.  Discussed rice protocol and use of OTC meds at home.  Encouraged PCP follow-up in 1 week if not improving.    MDM Rules/Calculators/A&P                      Patient with MSK pain after running into a door jam last night.  X-rays of sore areas are reassuring and do not show fracture.  Compartments of the upper extremity are soft.  She has distal intact circulation, motor, sensation.  Conservative measures indicated at this time with follow-up as needed if not improving as expected.  Final Clinical Impression(s) / ED Diagnoses Final diagnoses:  Right arm pain    Rx / DC Orders ED Discharge Orders         Ordered    lidocaine (LIDODERM) 5 %  Every 24 hours     04/17/20 1217           Carlisle Cater, PA-C 04/17/20 1219    Elnora Morrison, MD 04/19/20 [phone]

## 2020-07-02 IMAGING — DX DG HAND COMPLETE 3+V*R*
3 series · 3 of 3 positions shown · non-contrast
Comparison: None.

CLINICAL DATA: Pain after hitting upper extremity against solid
object

EXAM:
RIGHT HAND - COMPLETE 3+ VIEW

[hand pa]
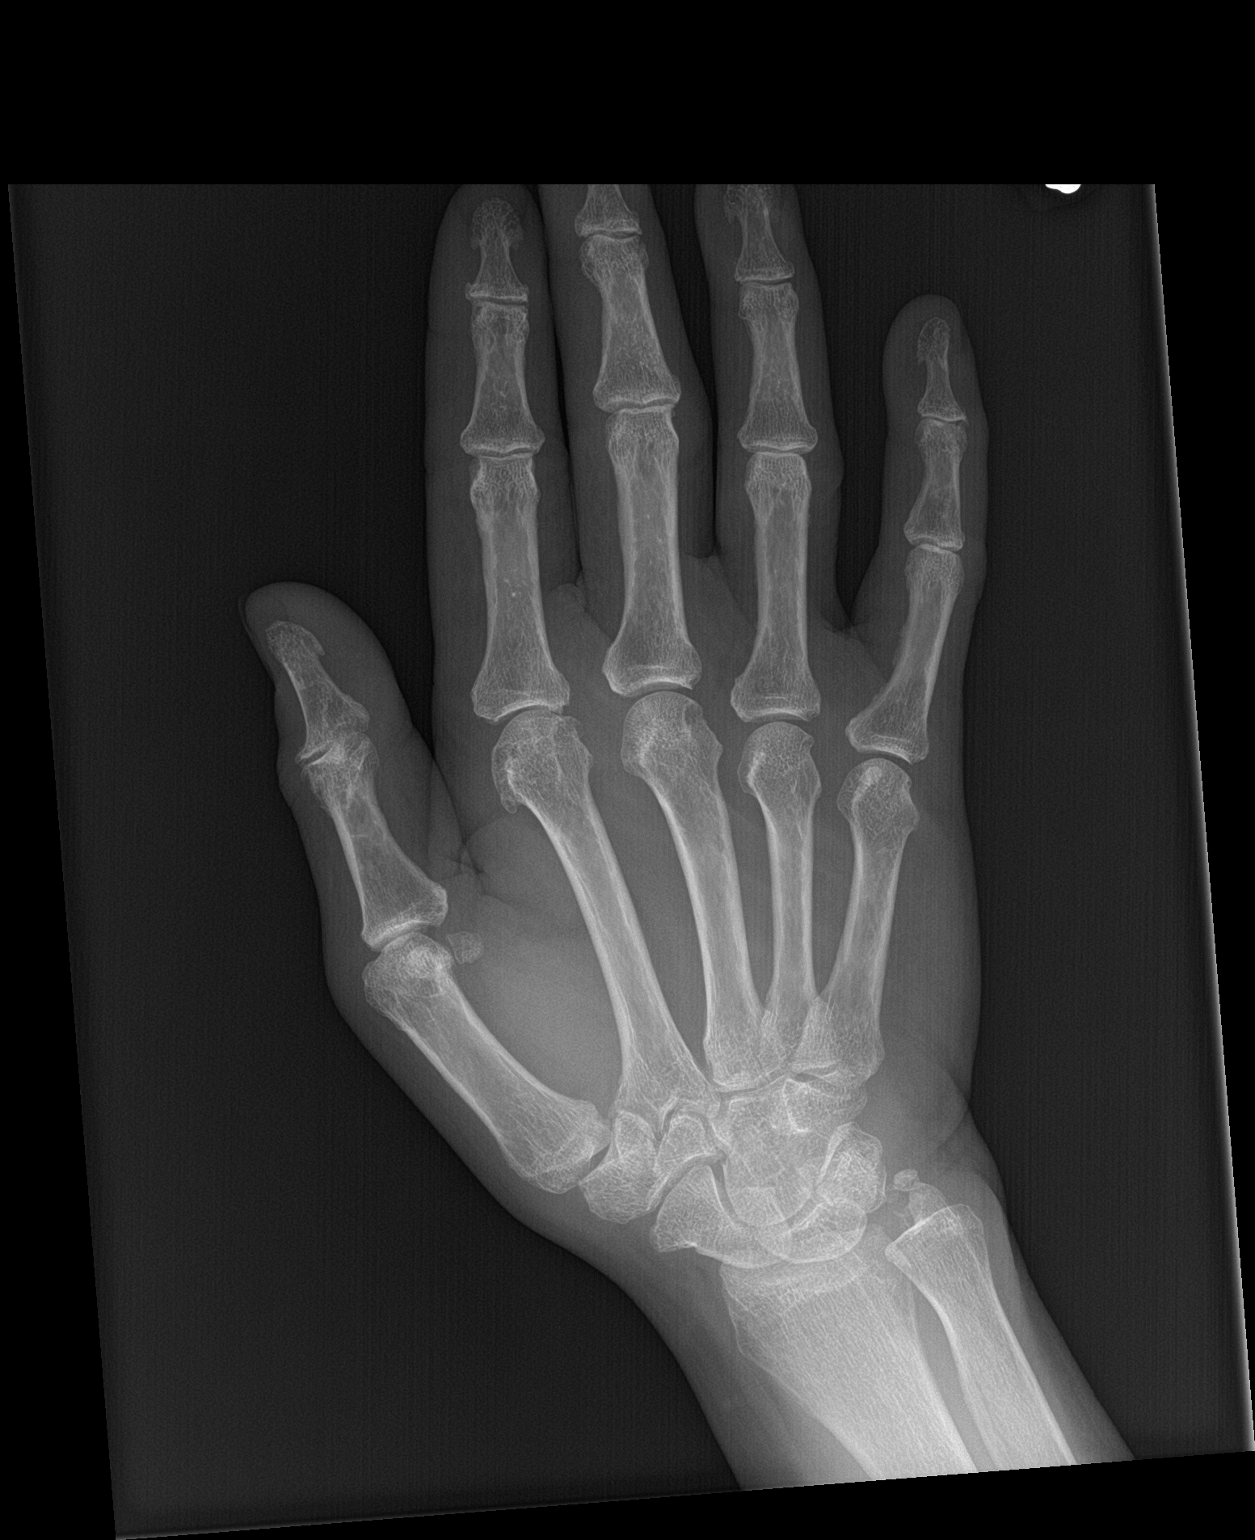

[hand obl]
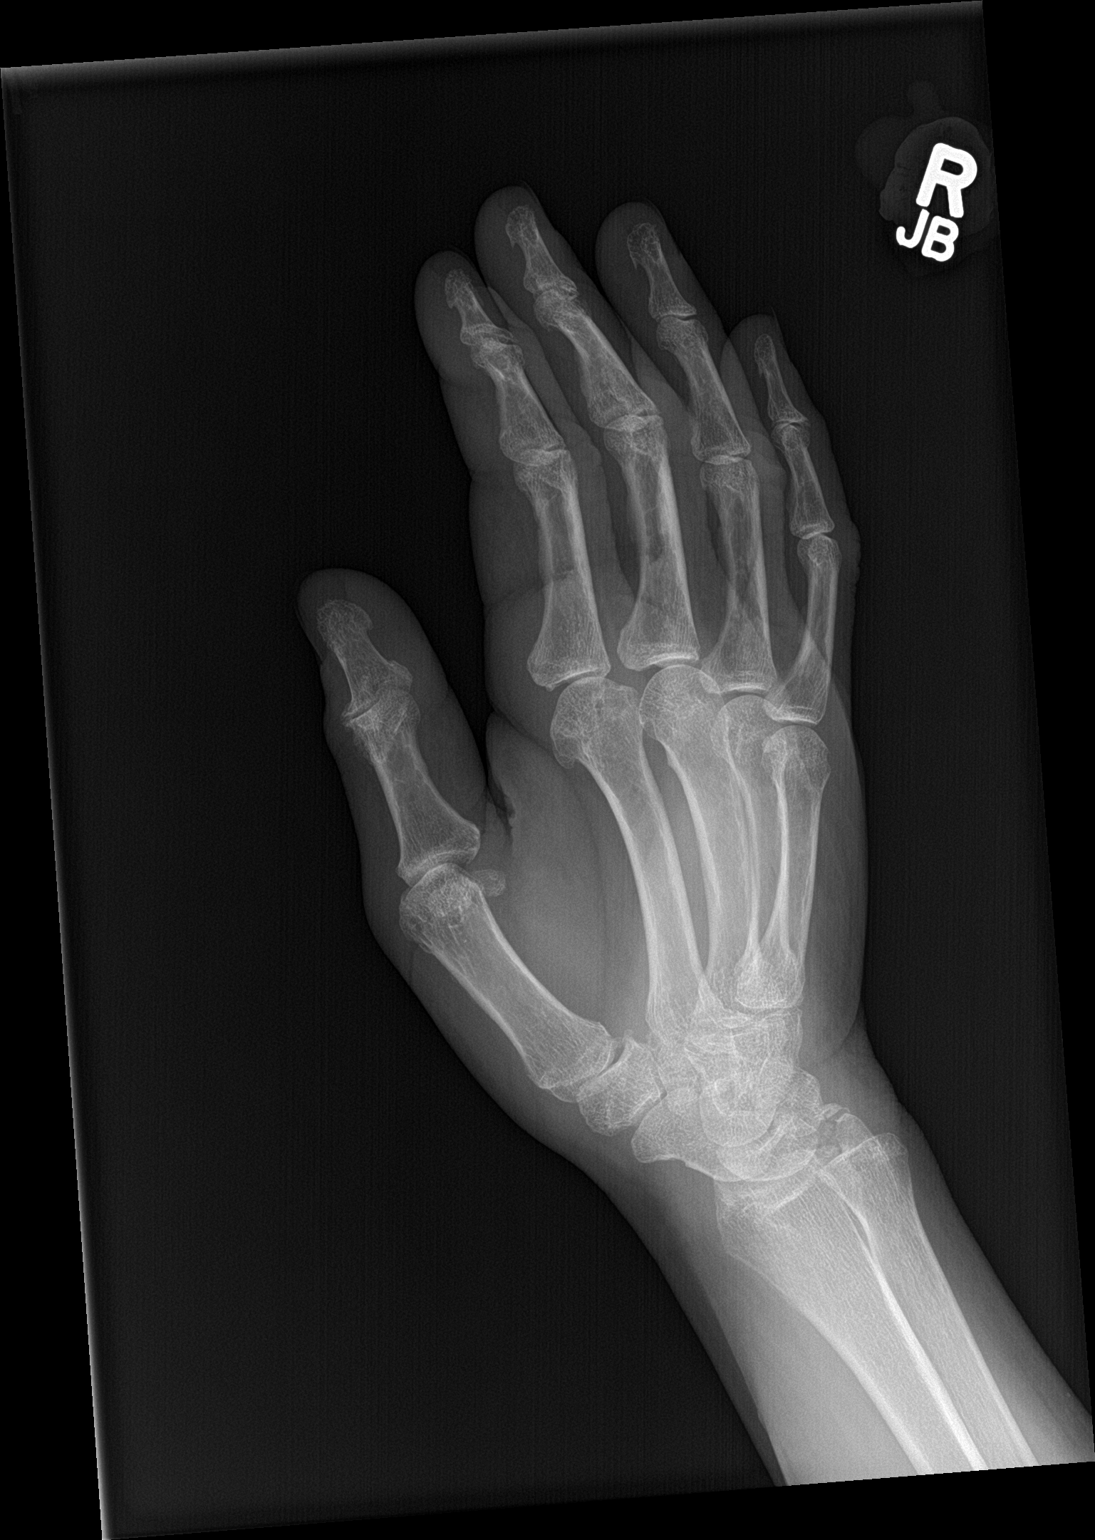

[hand lat]
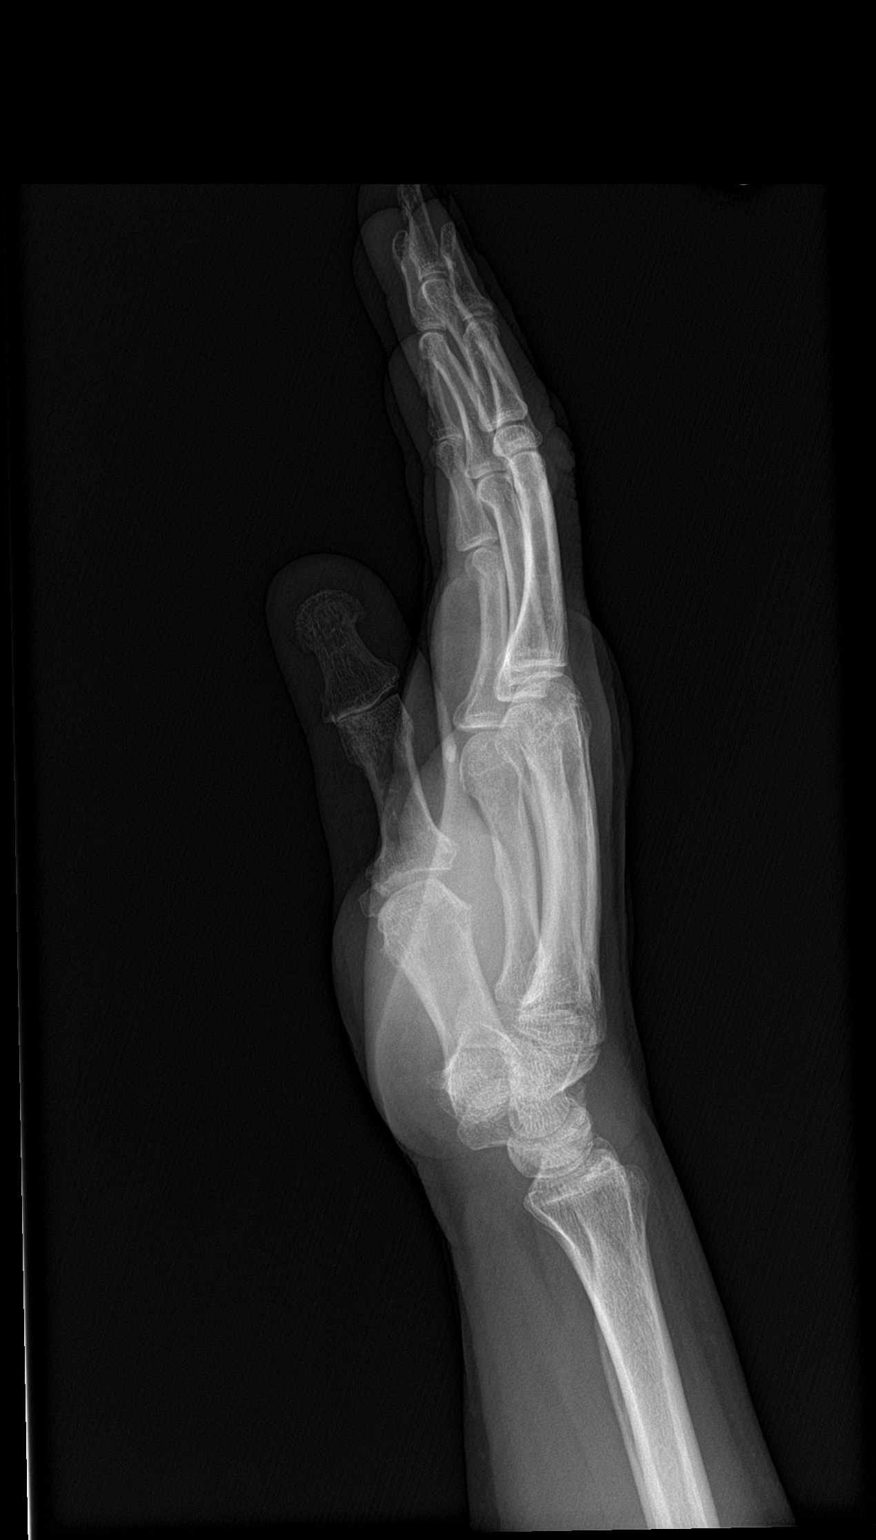

[3 of 3 positions shown; findings below may reference images not displayed]

FINDINGS: Frontal, oblique, and lateral views were obtained. There is no acute
fracture or dislocation. Calcification at the level of the ulnar
styloid is well corticated and may represent residua of old trauma.
There is spur formation in the first IP joint as well as in the
second, third, and fifth DIP joints. Minimal joint space narrowing
in these areas. There is mild joint space narrowing in the first and
second MCP joints. Other joint spaces appear unremarkable. No
erosive change.
IMPRESSION: No acute fracture or dislocation. Question residua of old trauma at
the ulnar styloid level. Relatively mild osteoarthritic change in
multiple joints as noted. No erosive change.

## 2021-10-16 ENCOUNTER — Emergency Department (HOSPITAL_BASED_OUTPATIENT_CLINIC_OR_DEPARTMENT_OTHER)
Admission: EM | Admit: 2021-10-16 | Discharge: 2021-10-16 | Discharge status: Against Medical Advice | Payer: PRIVATE HEALTH INSURANCE | Source: Home / Self Care

## 2021-10-16 ENCOUNTER — Other Ambulatory Visit: Payer: Self-pay

## 2022-12-19 ENCOUNTER — Encounter (HOSPITAL_BASED_OUTPATIENT_CLINIC_OR_DEPARTMENT_OTHER): Payer: Self-pay | Admitting: Emergency Medicine

## 2022-12-19 ENCOUNTER — Other Ambulatory Visit (HOSPITAL_BASED_OUTPATIENT_CLINIC_OR_DEPARTMENT_OTHER): Payer: Self-pay

## 2022-12-19 ENCOUNTER — Other Ambulatory Visit: Payer: Self-pay

## 2022-12-19 ENCOUNTER — Emergency Department (HOSPITAL_BASED_OUTPATIENT_CLINIC_OR_DEPARTMENT_OTHER): Payer: 59

## 2022-12-19 ENCOUNTER — Emergency Department (HOSPITAL_BASED_OUTPATIENT_CLINIC_OR_DEPARTMENT_OTHER)
Admission: EM | Admit: 2022-12-19 | Discharge: 2022-12-19 | Disposition: A | Discharge status: Home / Self Care | Payer: 59 | Attending: Emergency Medicine | Admitting: Emergency Medicine

## 2022-12-19 DIAGNOSIS — Z8673 Personal history of transient ischemic attack (TIA), and cerebral infarction without residual deficits: Secondary | ICD-10-CM | POA: Diagnosis not present

## 2022-12-19 DIAGNOSIS — Z1152 Encounter for screening for COVID-19: Secondary | ICD-10-CM | POA: Diagnosis not present

## 2022-12-19 DIAGNOSIS — E119 Type 2 diabetes mellitus without complications: Secondary | ICD-10-CM | POA: Diagnosis not present

## 2022-12-19 DIAGNOSIS — I10 Essential (primary) hypertension: Secondary | ICD-10-CM | POA: Insufficient documentation

## 2022-12-19 DIAGNOSIS — J069 Acute upper respiratory infection, unspecified: Secondary | ICD-10-CM | POA: Insufficient documentation

## 2022-12-19 DIAGNOSIS — R059 Cough, unspecified: Secondary | ICD-10-CM | POA: Diagnosis present

## 2022-12-19 LAB — RESP PANEL BY RT-PCR (RSV, FLU A&B, COVID)  RVPGX2
Influenza A by PCR: NEGATIVE
Influenza B by PCR: NEGATIVE
Resp Syncytial Virus by PCR: NEGATIVE
SARS Coronavirus 2 by RT PCR: NEGATIVE

## 2022-12-19 LAB — CBG MONITORING, ED: Glucose-Capillary: 95 mg/dL (ref 70–99)

## 2022-12-19 MED ORDER — BENZONATATE 100 MG PO CAPS
100.0000 mg | ORAL_CAPSULE | Freq: Three times a day (TID) | ORAL | 0 refills | Status: AC
  Filled 2022-12-19: qty 21, 7d supply, fill #0

## 2022-12-19 MED ORDER — FLUTICASONE PROPIONATE 50 MCG/ACT NA SUSP
2.0000 | Freq: Every day | NASAL | 0 refills | Status: AC
  Filled 2022-12-19: qty 16, 30d supply, fill #0

## 2022-12-19 NOTE — Discharge Instructions (Signed)

## 2022-12-19 NOTE — ED Provider Notes (Signed)
Emergency Department Provider Note   I have reviewed the triage vital signs and the nursing notes.   HISTORY  Chief Complaint Cough   HPI Evelyn Turner is a 77 y.o. female with PMH of DM and HTN presents to the emergency department for evaluation of cough.  Symptoms been present for the past week.  He describes a clear discharge with occasionally productive cough and tasting blood but no hemoptysis.  No chest pain or shortness of breath.  She has rhinorrhea. No sore throat. No abdominal pain, vomiting, and diarrhea.    Past Medical History:  Diagnosis Date   Diabetes mellitus    Hiatal hernia    Hypertension    Lumbar disc herniation    TIA (transient ischemic attack)     Review of Systems  Constitutional: No fever/chills Cardiovascular: Denies chest pain. Respiratory: Denies shortness of breath. Positive cough.  Gastrointestinal: No abdominal pain.  Musculoskeletal: Negative for back pain. Skin: Negative for rash. Neurological: Negative for headaches.  ____________________________________________   PHYSICAL EXAM:  VITAL SIGNS: ED Triage Vitals  Enc Vitals Group     BP 12/19/22 1549 (!) 165/59     Pulse Rate 12/19/22 1549 90     Resp 12/19/22 1549 20     Temp 12/19/22 1549 98.7 F (37.1 C)     Temp Source 12/19/22 1549 Oral     SpO2 12/19/22 1549 100 %     Weight 12/19/22 1547 250 lb (113.4 kg)     Height 12/19/22 1547 5' 6.5" (1.689 m)   Constitutional: Alert and oriented. Well appearing and in no acute distress. Eyes: Conjunctivae are normal.  Head: Atraumatic. Ears:  Healthy appearing ear canals and TMs bilaterally Nose: Positive congestion/rhinnorhea. Mouth/Throat: Mucous membranes are moist.  Oropharynx non-erythematous. Neck: No stridor.  Cardiovascular: Normal rate, regular rhythm. Good peripheral circulation. Grossly normal heart sounds.   Respiratory: Normal respiratory effort.  No retractions. Lungs CTAB. Gastrointestinal: Soft and  nontender. No distention.  Musculoskeletal: No lower extremity tenderness nor edema. No gross deformities of extremities. Neurologic:  Normal speech and language. No gross focal neurologic deficits are appreciated.  Skin:  Skin is warm, dry and intact. No rash noted.  ____________________________________________   LABS (all labs ordered are listed, but only abnormal results are displayed)  Labs Reviewed  RESP PANEL BY RT-PCR (RSV, FLU A&B, COVID)  [id number]  CBG MONITORING, ED   ____________________________________________  RADIOLOGY  DG Chest 2 View  Result Date: 12/19/2022 CLINICAL DATA:  Cough. EXAM: CHEST - 2 VIEW COMPARISON:  Chest x-ray Apr 17, 2020. FINDINGS: The heart size and mediastinal contours are within normal limits. Both lungs are clear. No visible pleural effusions or pneumothorax. No acute osseous abnormality. IMPRESSION: No active cardiopulmonary disease. Electronically Signed   By: Margaretha Sheffield M.D.   On: 12/19/2022 16:46    ____________________________________________   PROCEDURES  Procedure(s) performed:   Procedures  None  ____________________________________________   INITIAL IMPRESSION / ASSESSMENT AND PLAN / ED COURSE  Pertinent labs & imaging results that were available during my care of the patient were reviewed by me and considered in my medical decision making (see chart for details).   This patient is Presenting for Evaluation of cough/congestion, which does require a range of treatment options, and is a complaint that involves a moderate risk of morbidity and mortality.  The Differential Diagnoses include COVID, Flu, RSV, PNA, etc.    Clinical Laboratory Tests Ordered, included COVID, flu, RSV.  Radiologic Tests Ordered, included CXR.  I independently interpreted the images and agree with radiology interpretation.   Medical Decision Making: Summary:  Patient presents to the emergency department for evaluation of cough, congestion, runny  nose.  Plan for viral testing and chest x-ray.  No hypoxemia or increased work of breathing.  Reevaluation with update and discussion with patient.  Discussed chest x-ray results and viral panel.  Plan for Tessalon Perles and Flonase along with over-the-counter Mucinex.  Discussed plan for close PCP follow-up.  Patient's presentation is most consistent with acute illness / injury with system symptoms.   Disposition: discharge  ____________________________________________  FINAL CLINICAL IMPRESSION(S) / ED DIAGNOSES  Final diagnoses:  Viral URI with cough     NEW OUTPATIENT MEDICATIONS STARTED DURING THIS VISIT:  New Prescriptions   BENZONATATE (TESSALON) 100 MG CAPSULE    Take 1 capsule (100 mg total) by mouth every 8 (eight) hours.   FLUTICASONE (FLONASE) 50 MCG/ACT NASAL SPRAY    Place 2 sprays into both nostrils daily for 7 days.    Note:  This document was prepared using Dragon voice recognition software and may include unintentional dictation errors.  Nanda Quinton, MD, [facility] Emergency Medicine    Delante Karapetyan, [facility], MD 12/19/22 [phone]

## 2022-12-19 NOTE — ED Triage Notes (Signed)
Pt reports cough that is worse at night x 1 wk
